# Patient Record
Sex: Male | Born: 1995 | Race: Black or African American | Hispanic: No | Marital: Single | Smoking: Never smoker
Health system: Southern US, Community
[De-identification: ages and names within clinical notes are randomized; demographics above are authoritative.]

## PROBLEM LIST (undated history)

## (undated) HISTORY — PX: NO PAST SURGERIES: SHX2092

---

## 2019-04-22 ENCOUNTER — Other Ambulatory Visit: Payer: Self-pay

## 2019-04-22 ENCOUNTER — Ambulatory Visit
Admission: EM | Admit: 2019-04-22 | Discharge: 2019-04-22 | Disposition: A | Discharge status: Home / Self Care | Payer: Commercial Managed Care - PPO | Attending: Family Medicine | Admitting: Family Medicine

## 2019-04-22 DIAGNOSIS — R0981 Nasal congestion: Secondary | ICD-10-CM | POA: Diagnosis not present

## 2019-04-22 DIAGNOSIS — Z7189 Other specified counseling: Secondary | ICD-10-CM

## 2019-04-22 NOTE — ED Triage Notes (Signed)
Patient complains of nasal congestion, warm sweats since this morning. Patient states that a coworker was suspicious for COVID so he would like to rule this out.

## 2019-04-22 NOTE — ED Provider Notes (Signed)
[facility] ____________________________________________  Time seen: Approximately 6:59 PM  I have reviewed the triage vital signs and the nursing notes.   HISTORY  Chief Complaint Nasal Congestion  HPI Daniel Booker is a 23 y.o. male presenting for evaluation of nasal congestion and feeling warm today.  Patient reports this morning he woke up with nasal congestion that has progressively increased throughout the day.  States while at work today he is felt warm like he had a fever, but denies known fever.  Has not taken any over-the-counter antipyretics prior to arrival.  Denies sore throat, cough, chest pain, shortness of breath, taste or smell changes, vomiting or diarrhea or rash.  Does report he had a positive exposure to suspected COVID-19 at work as well as that he works at [employer] and around people in the public.  Denies other known sick contacts.  Continues eat and drink well.  Denies history of seasonal allergies.  States he does not normally have nasal congestion.   History reviewed. No pertinent past medical history. Denies   There are no active problems to display for this patient.   Past Surgical History:  Procedure Laterality Date  . NO PAST SURGERIES       No current facility-administered medications for this encounter.  No current outpatient medications on file.  Allergies Patient has no known allergies.  History reviewed. No pertinent family history.  Social History Social History   Tobacco Use  . Smoking status: Never Smoker  . Smokeless tobacco: Never Used  Substance Use Topics  . Alcohol use: Never    Frequency: Never  . Drug use: Never    Review of Systems Constitutional: No fever ENT: No sore throat. As above.  Cardiovascular: Denies chest pain. Respiratory: Denies shortness of breath. Gastrointestinal: No abdominal pain.  No nausea, no vomiting.  No diarrhea.  Musculoskeletal: Negative for back pain. Skin: Negative for  rash.  ____________________________________________   PHYSICAL EXAM:  VITAL SIGNS: ED Triage Vitals  Enc Vitals Group     BP 04/22/19 1717 (!) 147/84     Pulse Rate 04/22/19 1717 72     Resp 04/22/19 1717 16     Temp 04/22/19 1717 98.2 F (36.8 C)     Temp Source 04/22/19 1717 Oral     SpO2 04/22/19 1717 100 %     Weight 04/22/19 1715 155 lb (70.3 kg)     Height 04/22/19 1715 5\' 10"  (1.778 m)     Head Circumference --      Peak Flow --      Pain Score 04/22/19 1715 0     Pain Loc --      Pain Edu? --      Excl. in [place]? --     Constitutional: Alert and oriented. Well appearing and in no acute distress. Eyes: Conjunctivae are normal.  ENT      Head: Normocephalic and atraumatic.      Nose: Nasal congestion Cardiovascular: Normal rate, regular rhythm. Grossly normal heart sounds.  Good peripheral circulation. Respiratory: Normal respiratory effort without tachypnea nor retractions. Breath sounds are clear and equal bilaterally. No wheezes, rales, rhonchi. Gastrointestinal: Soft and nontender.  Musculoskeletal: Steady gait Neurologic:  Normal speech and language.Speech is normal. Skin:  Skin is warm, dry. Psychiatric: Mood and affect are normal. Speech and behavior are normal. Patient exhibits appropriate insight and judgment   ___________________________________________   LABS (all labs ordered are listed, but only abnormal results are displayed)  Labs Reviewed  NOVEL CORONAVIRUS, NAA (HOSPITAL ORDER, SEND-OUT TO REF LAB)   ____________________________________________   PROCEDURES Procedures    INITIAL IMPRESSION / ASSESSMENT AND PLAN / ED COURSE  Pertinent labs & imaging results that were available during my care of the patient were reviewed by me and considered in my medical decision making (see chart for details).  Well-appearing patient.  No acute distress.  Counseled regarding COVID-19 and COVID-19 testing performed.  Will await results.  [place] DHHS  information given instructed patient to follow, remain home unless seeking further care.  Self quarantine.  Over-the-counter medication as needed.  Discussed follow up and return parameters including no resolution or any worsening concerns. Patient verbalized understanding and agreed to plan.   ____________________________________________   FINAL CLINICAL IMPRESSION(S) / ED DIAGNOSES  Final diagnoses:  Nasal congestion  Advice Given About Covid-19 Virus Infection     ED Discharge Orders    None       Note: This dictation was prepared with Dragon dictation along with smaller phrase technology. Any transcriptional errors that result from this process are unintentional.         Renford DillsMiller, Eberardo Demello, NP 04/22/19 1918

## 2019-04-22 NOTE — Discharge Instructions (Addendum)
Over-the-counter medication as needed.  Rest. Drink plenty of fluids.  Return to [facility] remain home until testing results received unless seeking further care.  Soft quarantine for 10 days and until feeling better.  Follow up with your primary care physician this week as needed. Return to Urgent care for new or worsening concerns.

## 2019-04-25 LAB — NOVEL CORONAVIRUS, NAA (HOSP ORDER, SEND-OUT TO REF LAB; TAT 18-24 HRS): SARS-CoV-2, NAA: NOT DETECTED

## 2019-04-26 ENCOUNTER — Encounter (HOSPITAL_COMMUNITY): Payer: Self-pay

## 2020-01-03 DIAGNOSIS — N50812 Left testicular pain: Secondary | ICD-10-CM | POA: Insufficient documentation

## 2020-01-03 DIAGNOSIS — Z0000 Encounter for general adult medical examination without abnormal findings: Secondary | ICD-10-CM | POA: Insufficient documentation

## 2020-04-28 ENCOUNTER — Ambulatory Visit: Payer: Commercial Managed Care - PPO | Attending: Family

## 2020-04-28 DIAGNOSIS — Z23 Encounter for immunization: Secondary | ICD-10-CM

## 2020-05-01 NOTE — Progress Notes (Signed)
   Covid-19 Vaccination Clinic  Name:  CAMRIN GEARHEART    MRN: [id number] DOB: [birth year]  05/01/2020  Mr. Bechtol was observed post Covid-19 immunization for 15 minutes without incident. He was provided with Vaccine Information Sheet and instruction to access the V-Safe system.   Mr. Pascua was instructed to call 911 with any severe reactions post vaccine: Marland Kitchen Difficulty breathing  . Swelling of face and throat  . A fast heartbeat  . A bad rash all over body  . Dizziness and weakness   Immunizations Administered    Name Date Dose VIS Date Route   Pfizer COVID-19 Vaccine 04/28/2020  1:30 PM 0.3 mL 11/06/2018 Intramuscular   Manufacturer: [employer], [employer]   Lot: [id number]   NDC: [address]-1

## 2020-05-19 ENCOUNTER — Ambulatory Visit: Payer: Commercial Managed Care - PPO | Attending: Internal Medicine

## 2020-05-19 DIAGNOSIS — Z23 Encounter for immunization: Secondary | ICD-10-CM

## 2020-05-19 NOTE — Progress Notes (Signed)
   Covid-19 Vaccination Clinic  Name:  Daniel Booker    MRN: [id number] DOB: 05/06/1996  05/19/2020  [name] was observed post Covid-19 immunization for 15 minutes without incident. He was provided with Vaccine Information Sheet and instruction to access the V-Safe system.   [name] was instructed to call 911 with any severe reactions post vaccine: Marland Kitchen Difficulty breathing  . Swelling of face and throat  . A fast heartbeat  . A bad rash all over body  . Dizziness and weakness   Immunizations Administered    Name Date Dose VIS Date Route   Pfizer COVID-19 Vaccine 05/19/2020  3:12 PM 0.3 mL 11/06/2018 Intramuscular   Manufacturer: [employer], [employer]   Lot: [id number]   NDC: [id number]

## 2020-05-26 DIAGNOSIS — D7589 Other specified diseases of blood and blood-forming organs: Secondary | ICD-10-CM | POA: Insufficient documentation

## 2020-05-26 DIAGNOSIS — D72819 Decreased white blood cell count, unspecified: Secondary | ICD-10-CM | POA: Insufficient documentation

## 2020-09-29 ENCOUNTER — Other Ambulatory Visit: Payer: Commercial Managed Care - PPO

## 2020-12-07 ENCOUNTER — Other Ambulatory Visit: Payer: Self-pay | Admitting: *Deleted

## 2020-12-07 DIAGNOSIS — N50812 Left testicular pain: Secondary | ICD-10-CM

## 2020-12-08 ENCOUNTER — Other Ambulatory Visit: Payer: Self-pay

## 2020-12-08 ENCOUNTER — Encounter: Payer: Self-pay | Admitting: Urology

## 2020-12-08 ENCOUNTER — Ambulatory Visit: Payer: Commercial Managed Care - PPO | Admitting: Urology

## 2020-12-08 ENCOUNTER — Other Ambulatory Visit
Admission: RE | Admit: 2020-12-08 | Discharge: 2020-12-08 | Disposition: A | Discharge status: Home / Self Care | Payer: Commercial Managed Care - PPO | Attending: Urology | Admitting: Urology

## 2020-12-08 VITALS — BP 167/80 | HR 66 | Ht 70.0 in | Wt 165.0 lb

## 2020-12-08 DIAGNOSIS — N50812 Left testicular pain: Secondary | ICD-10-CM

## 2020-12-08 DIAGNOSIS — R300 Dysuria: Secondary | ICD-10-CM | POA: Diagnosis not present

## 2020-12-08 LAB — URINALYSIS, COMPLETE (UACMP) WITH MICROSCOPIC
Bilirubin Urine: NEGATIVE
Glucose, UA: NEGATIVE mg/dL
Hgb urine dipstick: NEGATIVE
Ketones, ur: NEGATIVE mg/dL
Leukocytes,Ua: NEGATIVE
Nitrite: NEGATIVE
Protein, ur: NEGATIVE mg/dL
Specific Gravity, Urine: 1.015 (ref 1.005–1.030)
pH: 6.5 (ref 5.0–8.0)

## 2020-12-08 MED ORDER — CELECOXIB 200 MG PO CAPS: 200.0000 mg | ORAL_CAPSULE | Freq: Every day | ORAL | 0 refills | Status: DC

## 2020-12-08 NOTE — Patient Instructions (Signed)
Urethritis, Adult  Urethritis is swelling (inflammation) of the urethra. The urethra is the tube that drains urine from the bladder. It is important to get treatment for this condition early. Delayed treatmentmay lead to complications. What are the causes? This condition may be caused by: Germs that are spread through sexual contact. This is the leading cause of urethritis. This may include bacterial or viral infections. Injury to the urethra. Injury can happen after a thin, flexible tube (catheter) is inserted into the urethra to drain urine, or after medical instruments or foreign bodies are inserted into the area. Chemical irritation. This may include contact with spermicide. A disease that causes inflammation. This is rare. What increases the risk? The following factors may make you more likely to develop this condition: Having sex without using a condom. Having multiple sexual partners. Having poor hygiene. What are the signs or symptoms? Symptoms of this condition include: Pain with urination. Frequent urination. Urgent need to urinate. Itching and pain in the vagina or penis. Discharge coming from the penis. However, women rarely have symptoms. How is this diagnosed? This condition is diagnosed based on your medical history and symptoms as well as a physical exam. Tests may also be done. These may include: Urine tests. Swabs from the urethra. How is this treated? Treatment for this condition depends on the cause. Urethritis caused by a bacterial infection is treated with antibiotic medicine. Any sexual partnersmust also be treated. Follow these instructions at home: Medicines Take over-the-counter and prescription medicines only as told by your health care provider. If you were prescribed antibiotic medicine, take it as told by your health care provider. Do not stop taking the antibiotic even if you start to feel better. Lifestyle Avoid using perfumed soaps, bubble bath, and  shampoo when you bathe or shower. Rinse the vaginal area after bathing. Wear cotton underwear. Not wearing underwear when going to bed can help. Make sure to wipe from front to back after using the toilet if you are male. Do not have sex until your health care provider approves. When you do have sex, be sure to practice safe sex. Tell anyone with whom you have had sexual relations in the past 60 days that he or she may be at risk of infection. General instructions Drink enough fluid to keep your urine clear or pale yellow. It is up to you to get your test results. Ask your health care provider, or the department that is doing the test, when your results will be ready. Keep all follow-up visits as told by your health care provider. This is important. Get tested again 3 months after treatment to make sure the infection is gone. It is important that your sexual partner also gets tested again. Contact a health care provider if: Your symptoms have not improved after 3 days. Your symptoms get worse. You have eye redness or pain. You develop abdominal pain or pelvic pain (in females). You develop joint pain. You have a fever. Get help right away if: You have severe pain in the belly, back, or side. You vomit repeatedly. Summary Urethritis is a swelling (inflammation) of the urethra. This condition is caused by germs that are spread through sexual contact. This is the main cause of this illness. It is important to get treatment for this condition early. Delayed treatment may lead to complications. Treatment for this condition depends on the cause. Any sexual partners must also be treated. This information is not intended to replace advice given to you by   treatment for this condition early. Delayed treatment may lead to complications.  Treatment for this condition depends on the cause. Any sexual partners must also be treated. This information is not intended to replace advice given to you by your health care provider. Make sure you discuss any questions you have with your health care provider. Document Revised: 08/11/2017 Document Reviewed:  10/04/2016 Elsevier Patient Education  2021 Elsevier Inc.   Urethral Stricture  Urethral stricture is narrowing of the tube (urethra) that carries urine from the bladder out of the body. The urethra can become narrow due to scar tissue from an injury or infection. This can make it difficult to pass urine. In women, the urethra opens above the vaginal opening. In men, the urethra opens at the tip of the penis, and the urethra is much longer than it is in women. Because of the length of the male urethra, urethral stricture is much more common in men. This condition is treated with surgery. What are the causes? In both men and women, common causes of urethral stricture include:  Urinary tract infection (UTI).  Sexually transmitted infection (STI).  Use of a tube placed into the urethra to drain urine from the bladder (urinary catheter).  Urinary tract surgery. In men, common causes of urethral stricture include:  A severe injury to the pelvis.  Prostate surgery.  Injury to the penis. In many cases, the cause of urethral stricture is not known. What increases the risk? You are more likely to develop this condition if you:  Are male. Men who have had prostate surgery are at risk of developing this condition.  Use a urinary catheter.  Have had urinary tract surgery. What are the signs or symptoms? The main symptom of this condition is difficulty passing urine. This may cause decreased urine flow, dribbling, or spraying of urine. Other symptom of this condition may include:  Frequent UTIs.  Blood in the urine.  Pain when urinating.  Swelling of the penis in men.  Inability to pass urine (urinary obstruction). How is this diagnosed? This condition may be diagnosed based on:  Your medical history and a physical exam.  Urine tests to check for infection or bleeding.  X-rays.  Ultrasound.  Retrograde urethrogram. This is a type of test in which dye is injected into the  urethra and then an X-ray is taken.  Urethroscopy. This is when a thin tube with a light and camera on the end (urethroscope) is used to look at the urethra. How is this treated? This condition is treated with surgery. The type of surgery that you have depends on the severity of your condition. You may have:  Urethral dilation. In this procedure, the narrow part of the urethra is stretched open (dilated) with dilating instruments or a small balloon.  Urethrotomy. In this procedure, a urethroscope is placed into the urethra, and the narrow part of the urethra is cut open with a surgical blade inserted through the urethroscope.  Open surgery. In this procedure, an incision is made in the urethra, the narrow part is removed, and the urethra is reconstructed. Follow these instructions at home:  Take over-the-counter and prescription medicines only as told by your health care provider.  If you were prescribed an antibiotic medicine, take it as told by your health care provider. Do not stop taking the antibiotic even if you start to feel better.  Drink enough fluid to keep your urine pale yellow.  Keep all follow-up visits as told by your  health care provider. This is important.   Contact a health care provider if:  You have signs of a urinary tract infection, such as: ? Frequent urination or passing small amounts of urine frequently. ? Needing to urinate urgently. ? Pain or burning with urination. ? Urine that smells bad or unusual. ? Cloudy urine. ? Pain in the lower abdomen or back. ? Trouble urinating. ? Blood in the urine. ? Vomiting or being less hungry than normal. ? Diarrhea or abdominal pain. ? Vaginal discharge, if you are male.  Your symptoms are getting worse instead of better. Get help right away if:  You cannot pass urine.  You have a fever.  You have swelling, bruising, or discoloration of your genital area. This includes the penis, scrotum, and inner thighs for  men, and the outer genital organs (vulva) and inner thighs for women.  You develop swelling in your legs.  You have difficulty breathing. Summary  Urethral stricture is narrowing of the tube (urethra) that carries urine from the bladder out of the body. The urethra can become narrow due to scar tissue from an injury or infection.  This condition can make it difficult to pass urine.  This condition is treated with surgery. The type of surgery that you have depends on the severity of your condition.  Contact a health care provider if your symptoms get worse or you have signs of a urinary tract infection. This information is not intended to replace advice given to you by your health care provider. Make sure you discuss any questions you have with your health care provider. Document Revised: 04/11/2018 Document Reviewed: 04/11/2018 Elsevier Patient Education  2021 [employer].

## 2020-12-08 NOTE — Progress Notes (Signed)
   12/08/20 10:37 AM   Daniel Booker [birth year] [id number]  CC: Left testicular pain, dysuria  HPI: I saw [name] in urology clinic today for the above issues.  He is a healthy 25 year old African-American male with no other medical problems he reports about a year of intermittent left testicular pain and dysuria.  There are no aggravating or alleviating factors.  He has previously been treated with antibiotics without any significant improvement.  He denies any prior culture documented UTIs, or any history of gross hematuria or urinary retention.  He denies any prior urologic surgeries.  The left testicular pain is worse with palpation of the left testicle.  Recent STD testing was completely negative.  Urinalysis today with 6-10 RBCs, rare bacteria, otherwise benign.   Surgical History: Past Surgical History:  Procedure Laterality Date  . NO PAST SURGERIES     Social History:  reports that he has never smoked. He has never used smokeless tobacco. He reports that he does not drink alcohol and does not use drugs.  Physical Exam: BP (!) 167/80   Pulse 66   Ht 5\' 10"  (1.778 m)   Wt 165 lb (74.8 kg)   BMI 23.68 kg/m    Constitutional:  Alert and oriented, No acute distress. Cardiovascular: No clubbing, cyanosis, or edema. Respiratory: Normal respiratory effort, no increased work of breathing. GI: Abdomen is soft, nontender, nondistended, no abdominal masses GU: Circumcised phallus with patent meatus, no lesions.  Testicles 20 cc and descended bilaterally without masses, no distinct tenderness on either side  Laboratory Data: Reviewed, see HPI  Pertinent Imaging: None to review  Assessment & Plan:   25 year old male with intermittent left testicular pain and dysuria of unclear etiology.  Exam is benign, but microscopic hematuria with 6-10 RBCs on urinalysis today.  We discussed possible etiologies including idiopathic, urethral stricture, epididymitis, urethritis, or other  more rare abnormalities.  I recommended a trial of Celebrex daily x2 weeks, with close follow-up and repeat urinalysis.  If persistent symptoms or microscopic hematuria would pursue cystoscopy at that time.  Could also consider a empiric trial of azithromycin, and consider scrotal ultrasound.  23, MD 12/08/2020  [facility] [address], [address] [place], [place] [phone]

## 2021-01-04 ENCOUNTER — Other Ambulatory Visit: Payer: Self-pay | Admitting: *Deleted

## 2021-01-04 DIAGNOSIS — R300 Dysuria: Secondary | ICD-10-CM

## 2021-01-05 ENCOUNTER — Other Ambulatory Visit: Payer: Self-pay

## 2021-01-05 ENCOUNTER — Encounter: Payer: Self-pay | Admitting: Urology

## 2021-01-05 ENCOUNTER — Other Ambulatory Visit
Admission: RE | Admit: 2021-01-05 | Discharge: 2021-01-05 | Disposition: A | Discharge status: Home / Self Care | Payer: Commercial Managed Care - PPO | Attending: Urology | Admitting: Urology

## 2021-01-05 ENCOUNTER — Ambulatory Visit: Payer: Commercial Managed Care - PPO | Admitting: Urology

## 2021-01-05 VITALS — BP 153/98 | HR 78 | Ht 70.25 in | Wt 164.0 lb

## 2021-01-05 DIAGNOSIS — N342 Other urethritis: Secondary | ICD-10-CM | POA: Diagnosis not present

## 2021-01-05 DIAGNOSIS — R310 Gross hematuria: Secondary | ICD-10-CM

## 2021-01-05 DIAGNOSIS — R300 Dysuria: Secondary | ICD-10-CM | POA: Diagnosis present

## 2021-01-05 LAB — URINALYSIS, COMPLETE (UACMP) WITH MICROSCOPIC
Bilirubin Urine: NEGATIVE
Glucose, UA: NEGATIVE mg/dL
Hgb urine dipstick: NEGATIVE
Ketones, ur: NEGATIVE mg/dL
Leukocytes,Ua: NEGATIVE
Nitrite: NEGATIVE
Protein, ur: NEGATIVE mg/dL
Specific Gravity, Urine: 1.01 (ref 1.005–1.030)
pH: 6.5 (ref 5.0–8.0)

## 2021-01-05 MED ORDER — DOXYCYCLINE HYCLATE 100 MG PO CAPS: 100.0000 mg | ORAL_CAPSULE | Freq: Two times a day (BID) | ORAL | 0 refills | Status: DC

## 2021-01-05 NOTE — Patient Instructions (Addendum)
I sent a prescription for doxycycline to your pharmacy for 7 days.  Avoid heavy sun exposure when taking this medication.  Let us know via MyChart or phone call how you are doing in 1 to 2 weeks.  Urethritis, Adult  Urethritis is swelling (inflammation) of the urethra. The urethra is the tube that drains urine from the bladder. It is important to get treatment for this condition early. Delayed treatment may lead to complications. What are the causes? This condition may be caused by:  Germs that are spread through sexual contact. This is the leading cause of urethritis. This may include bacterial or viral infections.  Injury to the urethra. Injury can happen after a thin, flexible tube (catheter) is inserted into the urethra to drain urine, or after medical instruments or foreign bodies are inserted into the area.  Chemical irritation. This may include contact with spermicide.  A disease that causes inflammation. This is rare. What increases the risk? The following factors may make you more likely to develop this condition:  Having sex without using a condom.  Having multiple sexual partners.  Having poor hygiene. What are the signs or symptoms? Symptoms of this condition include:  Pain with urination.  Frequent urination.  Urgent need to urinate.  Itching and pain in the vagina or penis.  Discharge coming from the penis. However, women rarely have symptoms. How is this diagnosed? This condition is diagnosed based on your medical history and symptoms as well as a physical exam. Tests may also be done. These may include:  Urine tests.  Swabs from the urethra. How is this treated? Treatment for this condition depends on the cause. Urethritis caused by a bacterial infection is treated with antibiotic medicine. Any sexual partners must also be treated. Follow these instructions at home: Medicines  Take over-the-counter and prescription medicines only as told by your health  care provider.  If you were prescribed antibiotic medicine, take it as told by your health care provider. Do not stop taking the antibiotic even if you start to feel better. Lifestyle  Avoid using perfumed soaps, bubble bath, and shampoo when you bathe or shower. Rinse the vaginal area after bathing.  Wear cotton underwear. Not wearing underwear when going to bed can help.  Make sure to wipe from front to back after using the toilet if you are male.  Do not have sex until your health care provider approves. When you do have sex, be sure to practice safe sex.  Tell anyone with whom you have had sexual relations in the past 60 days that he or she may be at risk of infection. General instructions  Drink enough fluid to keep your urine clear or pale yellow.  It is up to you to get your test results. Ask your health care provider, or the department that is doing the test, when your results will be ready.  Keep all follow-up visits as told by your health care provider. This is important.  Get tested again 3 months after treatment to make sure the infection is gone. It is important that your sexual partner also gets tested again. Contact a health care provider if:  Your symptoms have not improved after 3 days.  Your symptoms get worse.  You have eye redness or pain.  You develop abdominal pain or pelvic pain (in females).  You develop joint pain.  You have a fever. Get help right away if:  You have severe pain in the belly, back, or side.  You vomit repeatedly. Summary  Urethritis is a swelling (inflammation) of the urethra.  This condition is caused by germs that are spread through sexual contact. This is the main cause of this illness.  It is important to get treatment for this condition early. Delayed treatment may lead to complications.  Treatment for this condition depends on the cause. Any sexual partners must also be treated. This information is not intended to  replace advice given to you by your health care provider. Make sure you discuss any questions you have with your health care provider. Document Revised: 08/11/2017 Document Reviewed: 10/04/2016 Elsevier Patient Education  2021 [employer]  Cystoscopy Cystoscopy is a procedure that is used to help diagnose and sometimes treat conditions that affect the lower urinary tract. The lower urinary tract includes the bladder and the urethra. The urethra is the tube that drains urine from the bladder. Cystoscopy is done using a thin, tube-shaped instrument with a light and camera at the end (cystoscope). The cystoscope may be hard or flexible, depending on the goal of the procedure. The cystoscope is inserted through the urethra, into the bladder. Cystoscopy may be recommended if you have:  Urinary tract infections that keep coming back.  Blood in the urine (hematuria).  An inability to control when you urinate (urinary incontinence) or an overactive bladder.  Unusual cells found in a urine sample.  A blockage in the urethra, such as a urinary stone.  Painful urination.  An abnormality in the bladder found during an intravenous pyelogram (IVP) or CT scan. Cystoscopy may also be done to remove a sample of tissue to be examined under a microscope (biopsy). What are the risks? Generally, this is a safe procedure. However, problems may occur, including:  Infection.  Bleeding.  What happens during the procedure?  1. You will be given one or more of the following: ? A medicine to numb the area (local anesthetic). 2. The area around the opening of your urethra will be cleaned. 3. The cystoscope will be passed through your urethra into your bladder. 4. Germ-free (sterile) fluid will flow through the cystoscope to fill your bladder. The fluid will stretch your bladder so that your health care provider can clearly examine your bladder walls. 5. Your doctor will look at the urethra and  bladder. 6. The cystoscope will be removed The procedure may vary among health care providers  What can I expect after the procedure? After the procedure, it is common to have: 1. Some soreness or pain in your abdomen and urethra. 2. Urinary symptoms. These include: ? Mild pain or burning when you urinate. Pain should stop within a few minutes after you urinate. This may last for up to 1 week. ? A small amount of blood in your urine for several days. ? Feeling like you need to urinate but producing only a small amount of urine. Follow these instructions at home: General instructions  Return to your normal activities as told by your health care provider.   Do not drive for 24 hours if you were given a sedative during your procedure.  Watch for any blood in your urine. If the amount of blood in your urine increases, call your health care provider.  If a tissue sample was removed for testing (biopsy) during your procedure, it is up to you to get your test results. Ask your health care provider, or the department that is doing the test, when your results will be ready.  Drink enough fluid to  keep your urine pale yellow.  Keep all follow-up visits as told by your health care provider. This is important. Contact a health care provider if you:  Have pain that gets worse or does not get better with medicine, especially pain when you urinate.  Have trouble urinating.  Have more blood in your urine. Get help right away if you:  Have blood clots in your urine.  Have abdominal pain.  Have a fever or chills.  Are unable to urinate. Summary  Cystoscopy is a procedure that is used to help diagnose and sometimes treat conditions that affect the lower urinary tract.  Cystoscopy is done using a thin, tube-shaped instrument with a light and camera at the end.  After the procedure, it is common to have some soreness or pain in your abdomen and urethra.  Watch for any blood in your urine.  If the amount of blood in your urine increases, call your health care provider.  If you were prescribed an antibiotic medicine, take it as told by your health care provider. Do not stop taking the antibiotic even if you start to feel better. This information is not intended to replace advice given to you by your health care provider. Make sure you discuss any questions you have with your health care provider. Document Revised: 08/21/2018 Document Reviewed: 08/21/2018 Elsevier Patient Education  2020 [employer].

## 2021-01-05 NOTE — Progress Notes (Signed)
   01/05/2021 12:44 PM   Gwenlyn Found Ashmore 1996-02-18 [id number]  Reason for visit: Follow up dysuria, left testicular pain  HPI: I saw Mr. Segreto back for the above issues.  I originally saw him a month ago for primarily intermittent dysuria.  This is not all the time.  He has a tough time localizing his symptoms, but thinks it is primarily at the tip of the penis.  STD testing was negative.  UA at our last visit showed microscopic hematuria, and he is here today for repeat urinalysis and symptom check.  We trialed a course of Celebrex.  He reports no improvement in the dysuria.  He had 1 episode of gross hematuria at the end of the stream, and he has a picture today.  Urinalysis today is completely benign with no microscopic hematuria.  On exam there are no lesions at the meatus, he has some eczema throughout his arms and shoulders, with a similar lesion at the shaft of the penis.  He was recently prescribed a cream by his PCP for this, and he previously saw dermatology.  I do not have a good answer for what these lesions are, but they appear benign.  I recommended a trial of azithromycin for a non-gonococcal urethritis, and follow-up for cystoscopy in 1 to 2 weeks with his episode of gross hematuria and persistent urinary symptoms to rule out urethral stricture   Sondra Come, MD  [facility] [address], [address] [place], [place] [address] [phone]

## 2021-01-14 ENCOUNTER — Other Ambulatory Visit: Payer: Self-pay

## 2021-01-14 ENCOUNTER — Other Ambulatory Visit: Payer: Commercial Managed Care - PPO | Admitting: Urology

## 2021-01-14 ENCOUNTER — Encounter: Payer: Self-pay | Admitting: Urology

## 2021-01-14 ENCOUNTER — Ambulatory Visit: Payer: Commercial Managed Care - PPO | Admitting: Urology

## 2021-01-14 VITALS — BP 149/92 | HR 77 | Ht 70.0 in | Wt 164.0 lb

## 2021-01-14 DIAGNOSIS — R3129 Other microscopic hematuria: Secondary | ICD-10-CM

## 2021-01-14 DIAGNOSIS — R300 Dysuria: Secondary | ICD-10-CM

## 2021-01-14 MED ORDER — AMITRIPTYLINE HCL 25 MG PO TABS: 25.0000 mg | ORAL_TABLET | Freq: Every day | ORAL | 1 refills | Status: DC

## 2021-01-14 MED ORDER — URIBEL 118 MG PO CAPS: 1.0000 | ORAL_CAPSULE | Freq: Every day | ORAL | 0 refills | Status: AC

## 2021-01-14 NOTE — Addendum Note (Signed)
Addended by: Frankey Shown on: 01/14/2021 09:19 AM   Modules accepted: Orders

## 2021-01-14 NOTE — Progress Notes (Signed)
Cystoscopy Procedure Note:  Indication: Dysuria, gross hematuria and microscopic hematuria  After informed consent and discussion of the procedure and its risks, Daniel Booker was positioned and prepped in the standard fashion. Cystoscopy was performed with a flexible cystoscope.  The proximal urethra was very subtly narrow, but accommodated the scope easily, no suspicious lesions.  The urethra, bladder neck and entire bladder was visualized in a standard fashion. The prostate was short and normal-appearing in size. The ureteral orifices were visualized in their normal location and orientation.  No bladder abnormalities, no abnormalities on retroflexion.  Imaging: Renal/bladder ultrasound ordered  Findings: Essentially normal cystoscopy  Assessment and Plan: 25 year old male with 1+ year of intermittent dysuria of unclear etiology.  He also had 1 episode of a small amount of gross hematuria associated with significant burning with urination, and he has had microscopic hematuria in the past with 6-10 RBCs.  Infectious work-up has been negative.  He has previously trialed a course of Celebrex and azithromycin without any improvement.  Follow-up renal/bladder ultrasound results, call with results Trial of amitriptyline 25 mg nightly and Uribel as needed for dysuria RTC 2 to 4 weeks symptom check, he is moving so we will make this a virtual visit  Legrand Rams, MD 01/14/2021

## 2021-01-15 LAB — URINALYSIS, COMPLETE
Bilirubin, UA: NEGATIVE
Glucose, UA: NEGATIVE
Ketones, UA: NEGATIVE
Leukocytes,UA: NEGATIVE
Nitrite, UA: NEGATIVE
RBC, UA: NEGATIVE
Specific Gravity, UA: 1.025 (ref 1.005–1.030)
Urobilinogen, Ur: 1 mg/dL (ref 0.2–1.0)
pH, UA: 7 (ref 5.0–7.5)

## 2021-01-15 LAB — MICROSCOPIC EXAMINATION
Bacteria, UA: NONE SEEN
Epithelial Cells (non renal): NONE SEEN /hpf (ref 0–10)

## 2021-01-27 NOTE — Telephone Encounter (Signed)
Would still recommend the ultrasound because of the microscopic blood in the urine previously  Legrand Rams, MD 01/27/2021

## 2021-02-05 ENCOUNTER — Other Ambulatory Visit: Payer: Self-pay

## 2021-02-05 ENCOUNTER — Ambulatory Visit (HOSPITAL_COMMUNITY)
Admission: RE | Admit: 2021-02-05 | Discharge: 2021-02-05 | Disposition: A | Discharge status: Home / Self Care | Payer: Commercial Managed Care - PPO | Source: Ambulatory Visit | Attending: Urology | Admitting: Urology

## 2021-02-05 DIAGNOSIS — R3129 Other microscopic hematuria: Secondary | ICD-10-CM | POA: Diagnosis present

## 2021-02-09 ENCOUNTER — Other Ambulatory Visit: Payer: Self-pay

## 2021-02-09 ENCOUNTER — Telehealth (INDEPENDENT_AMBULATORY_CARE_PROVIDER_SITE_OTHER): Payer: Commercial Managed Care - PPO | Admitting: Urology

## 2021-02-09 DIAGNOSIS — R300 Dysuria: Secondary | ICD-10-CM | POA: Diagnosis not present

## 2021-02-09 MED ORDER — AMITRIPTYLINE HCL 25 MG PO TABS: 25.0000 mg | ORAL_TABLET | Freq: Every day | ORAL | 5 refills | Status: AC

## 2021-02-09 NOTE — Progress Notes (Signed)
Virtual Visit via Telephone Note  I connected with Mehar Kirkwood Deats on 02/09/21 at  8:30 AM EDT by telephone and verified that I am speaking with the correct person using two identifiers.   Patient location: Home Provider location: [facility]   I discussed the limitations, risks, security and privacy concerns of performing an evaluation and management service by telephone and the availability of in person appointments. We discussed the impact of the COVID-19 pandemic on the healthcare system, and the importance of social distancing and reducing patient and provider exposure. I also discussed with the patient that there may be a patient responsible charge related to this service. The patient expressed understanding and agreed to proceed.  Reason for visit: Follow up dysuria, microscopic hematuria  History of Present Illness: 25 year old male with 1+ year history of intermittent dysuria of unclear etiology and history of microscopic hematuria.  Cystoscopy and renal/bladder ultrasound were negative.  He had no improvement previously on Celebrex and azithromycin.  He has been on amitriptyline 25 mg nightly with significant improvement over the last month.  We discussed options at length including increasing the amitriptyline to 50 mg nightly if he was still having some discomfort, versus discontinuing the medication in 2 to 3 months and see how he is feeling.  Return precautions discussed extensively.  Virtual visit follow-up 6 months    I discussed the assessment and treatment plan with the patient. The patient was provided an opportunity to ask questions and all were answered. The patient agreed with the plan and demonstrated an understanding of the instructions.   The patient was advised to call back or seek an in-person evaluation if the symptoms worsen or if the condition fails to improve as anticipated.  I provided 6 minutes of non-face-to-face time during this  encounter.   Sondra Come, MD

## 2021-08-11 IMAGING — US US RENAL
1 series · 14 of 25 positions shown · non-contrast
Comparison: None.

CLINICAL DATA: Hematuria

EXAM:
RENAL / URINARY TRACT ULTRASOUND COMPLETE

[Series 1: us renal · 14 of 64 slices shown]
[im 1/64]
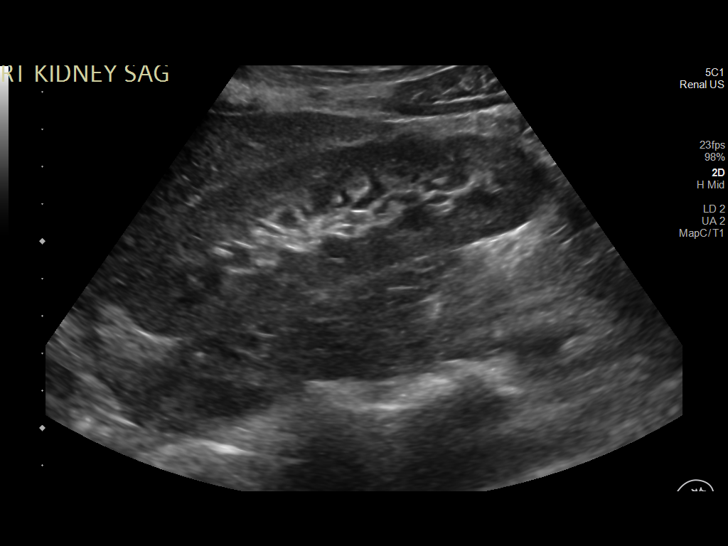
[im 6/64]
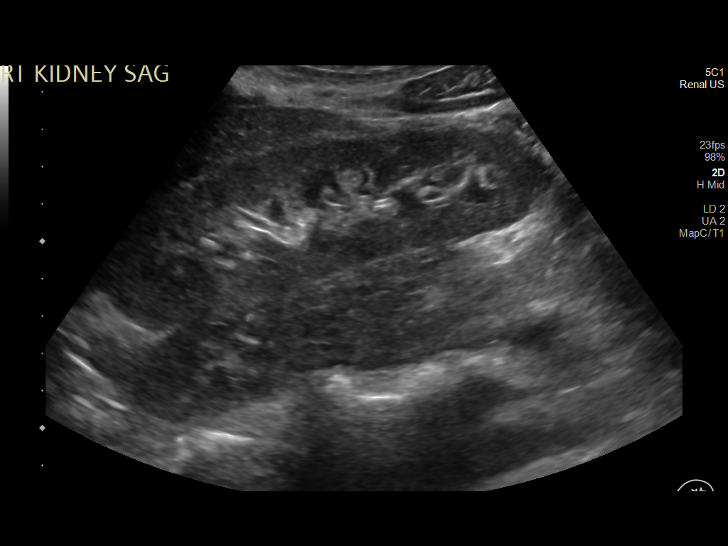
[im 11/64]
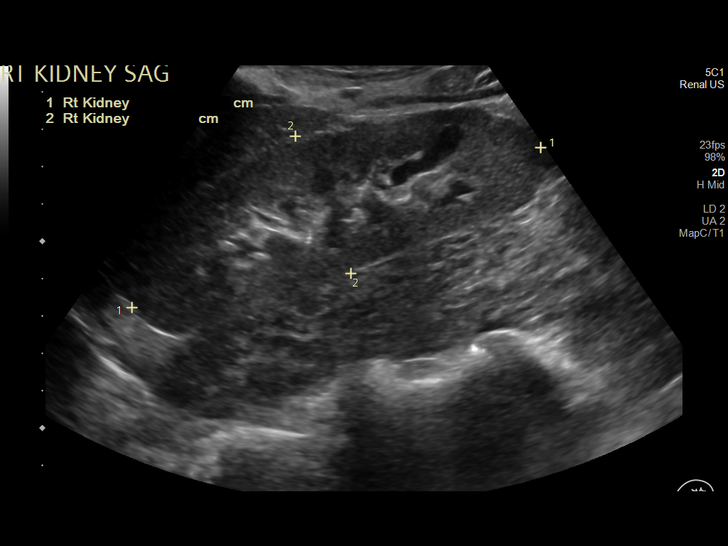
[im 16/64]
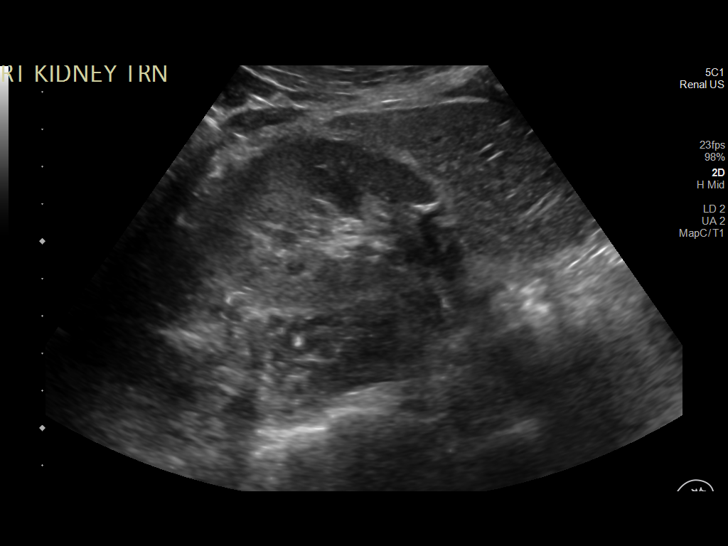
[im 22/64]
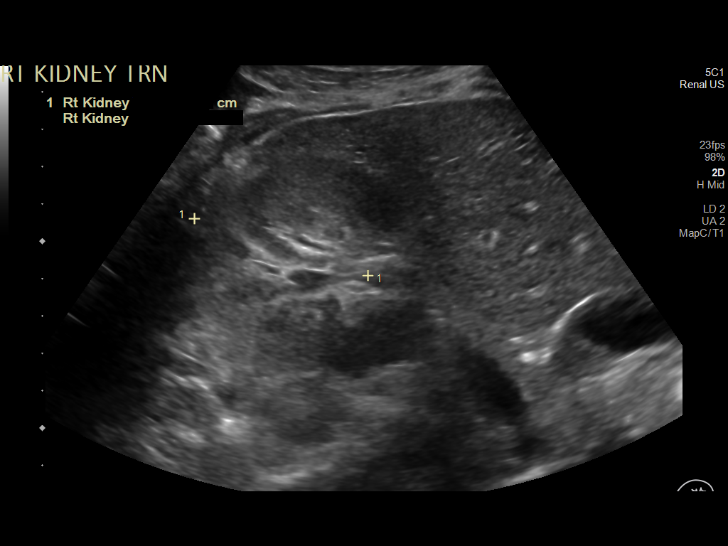
[im 24/64]
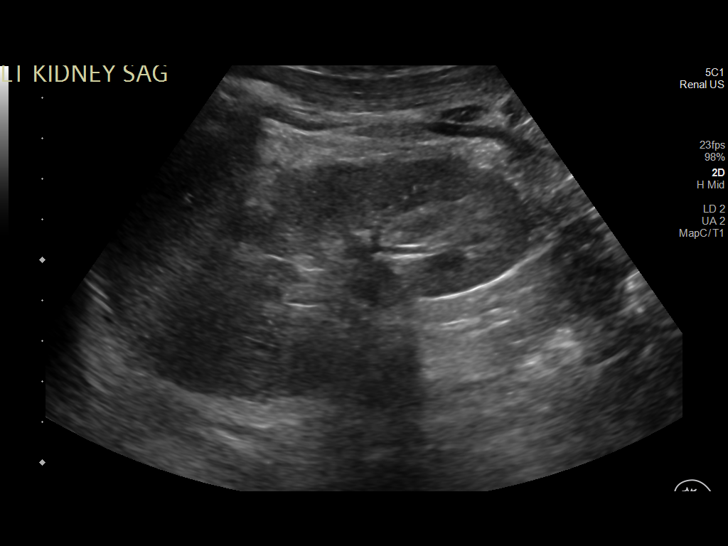
[im 29/64]
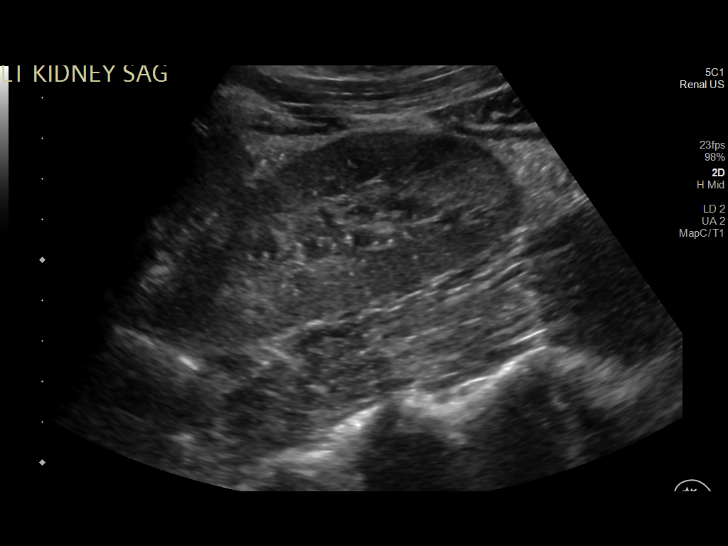
[im 35/64]
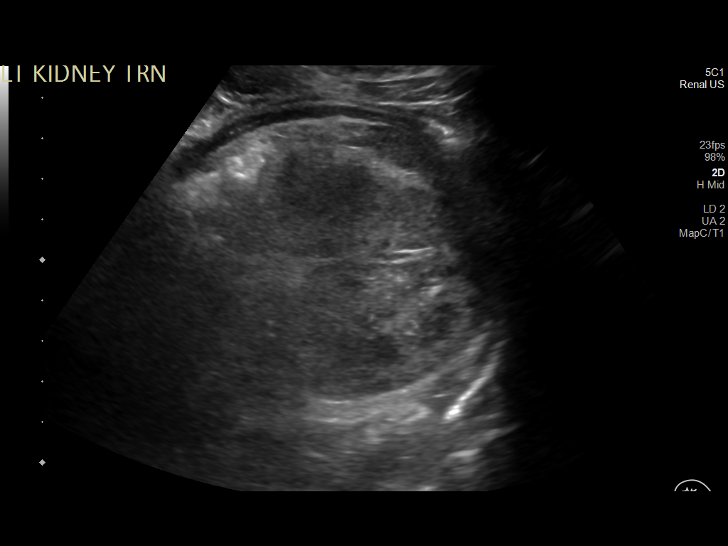
[im 40/64]
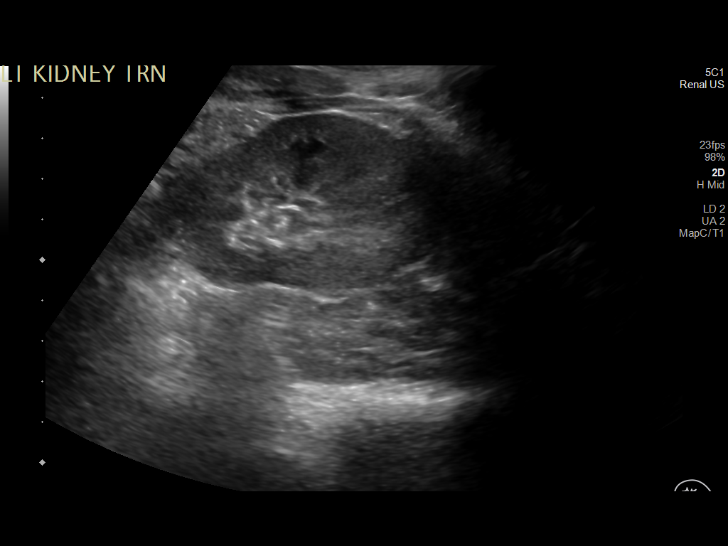
[im 43/64]
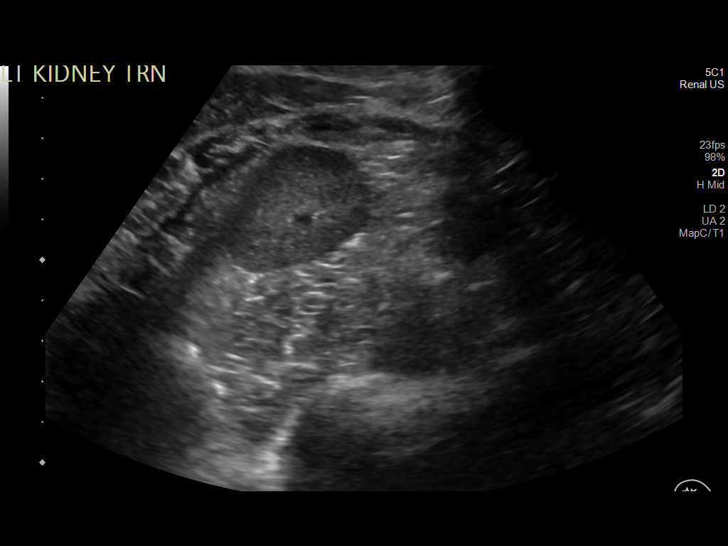
[im 48/64]
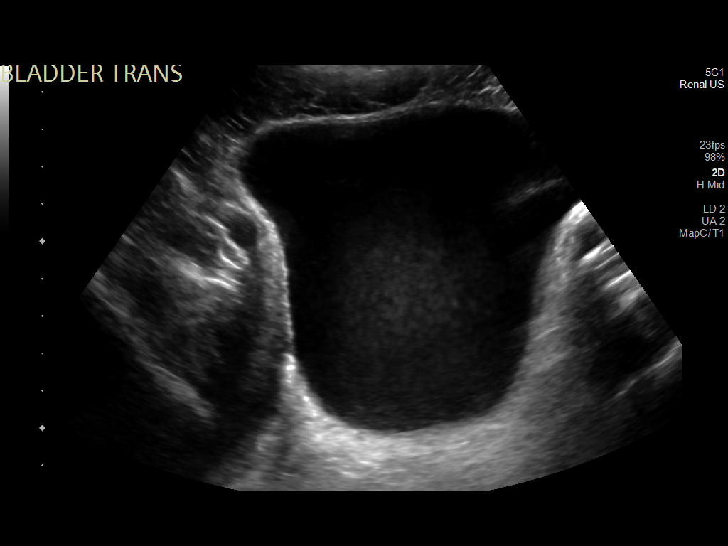
[im 53/64]
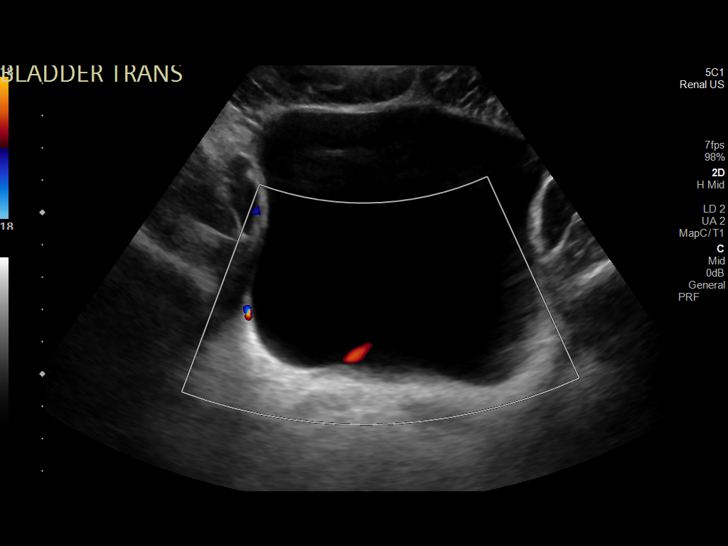
[im 58/64]
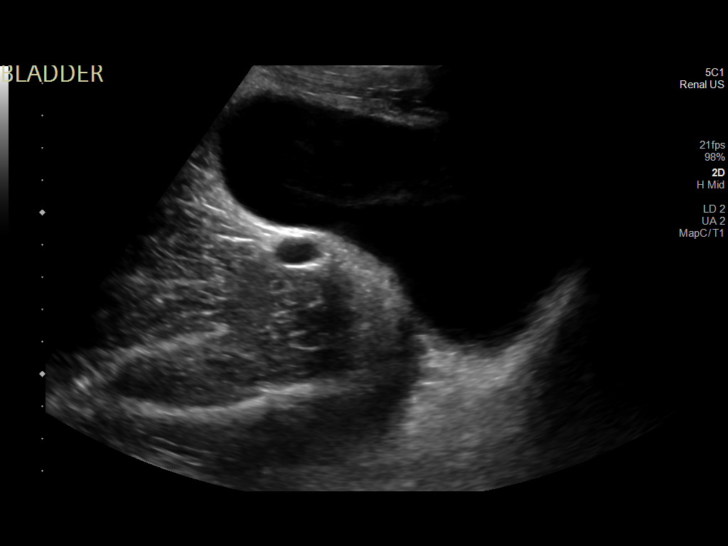
[im 64/64]
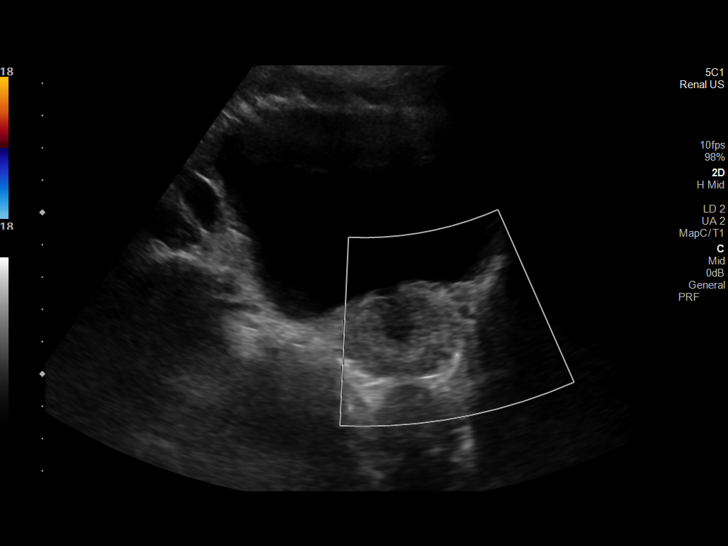

[14 of 25 positions shown; findings below may reference images not displayed]

FINDINGS: Right Kidney:

Renal measurements: 11.8 x 4 x 4.9 cm = volume: 119.1 mL.
Echogenicity within normal limits. No mass or hydronephrosis
visualized.

Left Kidney:

Renal measurements: 10.5 x 4.3 by 4.3 cm = volume: 102.4 mL.
Echogenicity within normal limits. No mass or hydronephrosis
visualized.

Bladder:

Appears normal for degree of bladder distention.

Other:

None.
IMPRESSION: Negative renal ultrasound

## 2021-08-17 ENCOUNTER — Telehealth: Payer: Commercial Managed Care - PPO | Admitting: Urology
